# Patient Record
Sex: Female | Born: 1935 | Race: White | Hispanic: No
Health system: Southern US, Community
[De-identification: ages and names within clinical notes are randomized; demographics above are authoritative.]

---

## 1998-08-28 ENCOUNTER — Encounter: Admission: RE | Admit: 1998-08-28 | Discharge: 1998-09-11 | Payer: Self-pay | Admitting: Family Medicine

## 1999-11-17 ENCOUNTER — Encounter: Admission: RE | Admit: 1999-11-17 | Discharge: 1999-11-17 | Payer: Self-pay | Admitting: Family Medicine

## 1999-11-17 ENCOUNTER — Encounter: Payer: Self-pay | Admitting: Family Medicine

## 1999-11-27 ENCOUNTER — Encounter: Admission: RE | Admit: 1999-11-27 | Discharge: 2000-02-25 | Payer: Self-pay | Admitting: Anesthesiology

## 2000-04-04 ENCOUNTER — Encounter: Admission: RE | Admit: 2000-04-04 | Discharge: 2000-07-03 | Payer: Self-pay | Admitting: Anesthesiology

## 2000-05-24 ENCOUNTER — Ambulatory Visit (HOSPITAL_COMMUNITY): Admission: RE | Admit: 2000-05-24 | Discharge: 2000-05-24 | Payer: Self-pay | Admitting: Gastroenterology

## 2000-06-21 ENCOUNTER — Encounter: Payer: Self-pay | Admitting: Emergency Medicine

## 2000-06-21 ENCOUNTER — Inpatient Hospital Stay (HOSPITAL_COMMUNITY): Admission: EM | Admit: 2000-06-21 | Discharge: 2000-06-22 | Payer: Self-pay | Admitting: Emergency Medicine

## 2000-07-04 ENCOUNTER — Encounter: Admission: RE | Admit: 2000-07-04 | Discharge: 2000-09-14 | Payer: Self-pay | Admitting: Anesthesiology

## 2000-08-04 ENCOUNTER — Encounter: Admission: RE | Admit: 2000-08-04 | Discharge: 2000-08-04 | Payer: Self-pay | Admitting: Family Medicine

## 2000-08-04 ENCOUNTER — Encounter: Payer: Self-pay | Admitting: Family Medicine

## 2000-09-23 ENCOUNTER — Encounter: Admission: RE | Admit: 2000-09-23 | Discharge: 2000-10-15 | Payer: Self-pay | Admitting: Anesthesiology

## 2000-10-31 ENCOUNTER — Encounter: Payer: Self-pay | Admitting: Anesthesiology

## 2000-10-31 ENCOUNTER — Encounter: Admission: RE | Admit: 2000-10-31 | Discharge: 2000-10-31 | Payer: Self-pay | Admitting: Anesthesiology

## 2000-11-03 ENCOUNTER — Inpatient Hospital Stay (HOSPITAL_COMMUNITY): Admission: EM | Admit: 2000-11-03 | Discharge: 2000-11-04 | Payer: Self-pay | Admitting: Emergency Medicine

## 2000-12-14 ENCOUNTER — Ambulatory Visit (HOSPITAL_COMMUNITY): Admission: RE | Admit: 2000-12-14 | Discharge: 2000-12-15 | Payer: Self-pay | Admitting: Internal Medicine

## 2001-12-26 ENCOUNTER — Encounter: Payer: Self-pay | Admitting: Family Medicine

## 2001-12-26 ENCOUNTER — Encounter: Admission: RE | Admit: 2001-12-26 | Discharge: 2001-12-26 | Payer: Self-pay | Admitting: Family Medicine

## 2003-05-15 ENCOUNTER — Encounter: Admission: RE | Admit: 2003-05-15 | Discharge: 2003-05-15 | Payer: Self-pay | Admitting: Family Medicine

## 2003-11-28 ENCOUNTER — Ambulatory Visit (HOSPITAL_COMMUNITY): Admission: RE | Admit: 2003-11-28 | Discharge: 2003-11-28 | Payer: Self-pay | Admitting: Anesthesiology

## 2003-12-20 ENCOUNTER — Encounter: Admission: RE | Admit: 2003-12-20 | Discharge: 2003-12-20 | Payer: Self-pay | Admitting: Anesthesiology

## 2004-03-02 ENCOUNTER — Inpatient Hospital Stay (HOSPITAL_COMMUNITY): Admission: RE | Admit: 2004-03-02 | Discharge: 2004-03-06 | Payer: Self-pay | Admitting: Neurosurgery

## 2004-04-01 ENCOUNTER — Encounter: Admission: RE | Admit: 2004-04-01 | Discharge: 2004-04-01 | Payer: Self-pay | Admitting: Neurosurgery

## 2004-04-17 ENCOUNTER — Ambulatory Visit (HOSPITAL_COMMUNITY): Admission: RE | Admit: 2004-04-17 | Discharge: 2004-04-17 | Payer: Self-pay | Admitting: Neurological Surgery

## 2004-04-29 ENCOUNTER — Encounter: Admission: RE | Admit: 2004-04-29 | Discharge: 2004-04-29 | Payer: Self-pay | Admitting: Neurosurgery

## 2004-05-01 ENCOUNTER — Encounter: Admission: RE | Admit: 2004-05-01 | Discharge: 2004-05-01 | Payer: Self-pay | Admitting: Neurosurgery

## 2004-06-02 ENCOUNTER — Encounter: Admission: RE | Admit: 2004-06-02 | Discharge: 2004-06-02 | Payer: Self-pay | Admitting: Neurosurgery

## 2004-06-02 ENCOUNTER — Ambulatory Visit (HOSPITAL_COMMUNITY): Admission: RE | Admit: 2004-06-02 | Discharge: 2004-06-02 | Payer: Self-pay | Admitting: Neurosurgery

## 2004-06-18 ENCOUNTER — Encounter: Admission: RE | Admit: 2004-06-18 | Discharge: 2004-06-18 | Payer: Self-pay | Admitting: Neurosurgery

## 2004-06-19 ENCOUNTER — Encounter: Admission: RE | Admit: 2004-06-19 | Discharge: 2004-06-19 | Payer: Self-pay | Admitting: Neurosurgery

## 2004-07-28 ENCOUNTER — Encounter: Admission: RE | Admit: 2004-07-28 | Discharge: 2004-07-28 | Payer: Self-pay | Admitting: Family Medicine

## 2004-09-17 ENCOUNTER — Encounter: Admission: RE | Admit: 2004-09-17 | Discharge: 2004-09-17 | Payer: Self-pay | Admitting: Neurosurgery

## 2005-03-11 ENCOUNTER — Encounter: Admission: RE | Admit: 2005-03-11 | Discharge: 2005-03-11 | Payer: Self-pay | Admitting: Neurosurgery

## 2005-06-10 IMAGING — CR DG LUMBAR SPINE 2-3V
3 series · 3 of 3 positions shown · non-contrast
Comparison: none

CLINICAL DATA: Followup spinal fusion.  
 LUMBAR SPINE ? TWO VIEW:
 Comparison is made to intraoperative views from 03/02/04.  
 The patient has had posterior laminectomy with interbody fusion at L3-4.  There are bilateral pedicle screws with posterior rod fixation.  Interbody fusion material is in place.  Findings appear very similar to the operative films.  There is some end plate irregularity towards the right with sclerosis but these findings were present on the preoperative films of last [REDACTED].  I do not see any sign of radiographically detectible complication.

[view not recorded (1 of 3)]
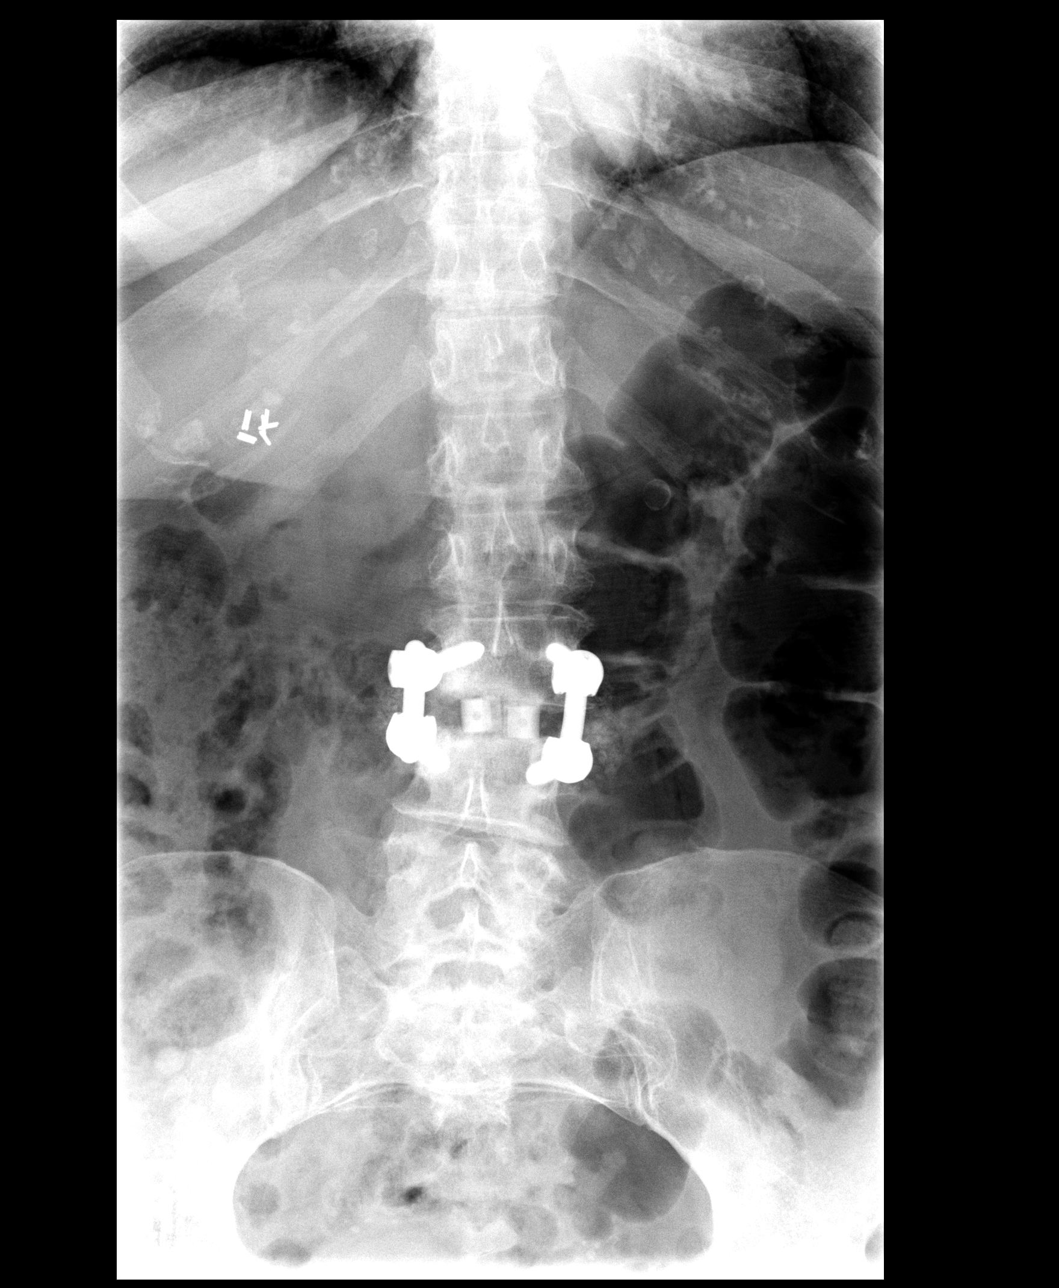

[view not recorded (2 of 3)]
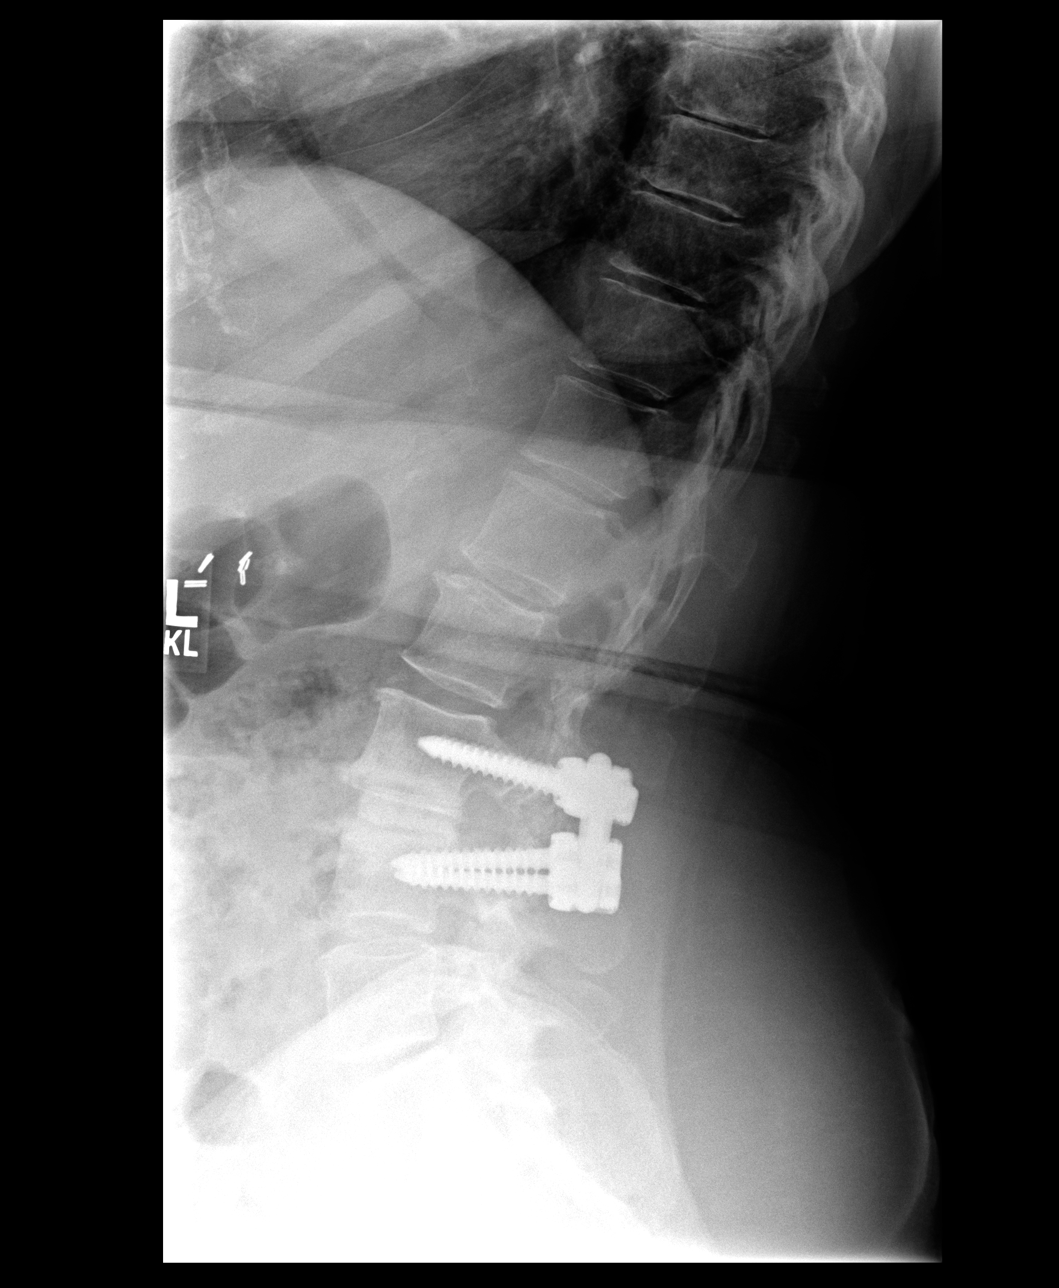

[view not recorded (3 of 3)]
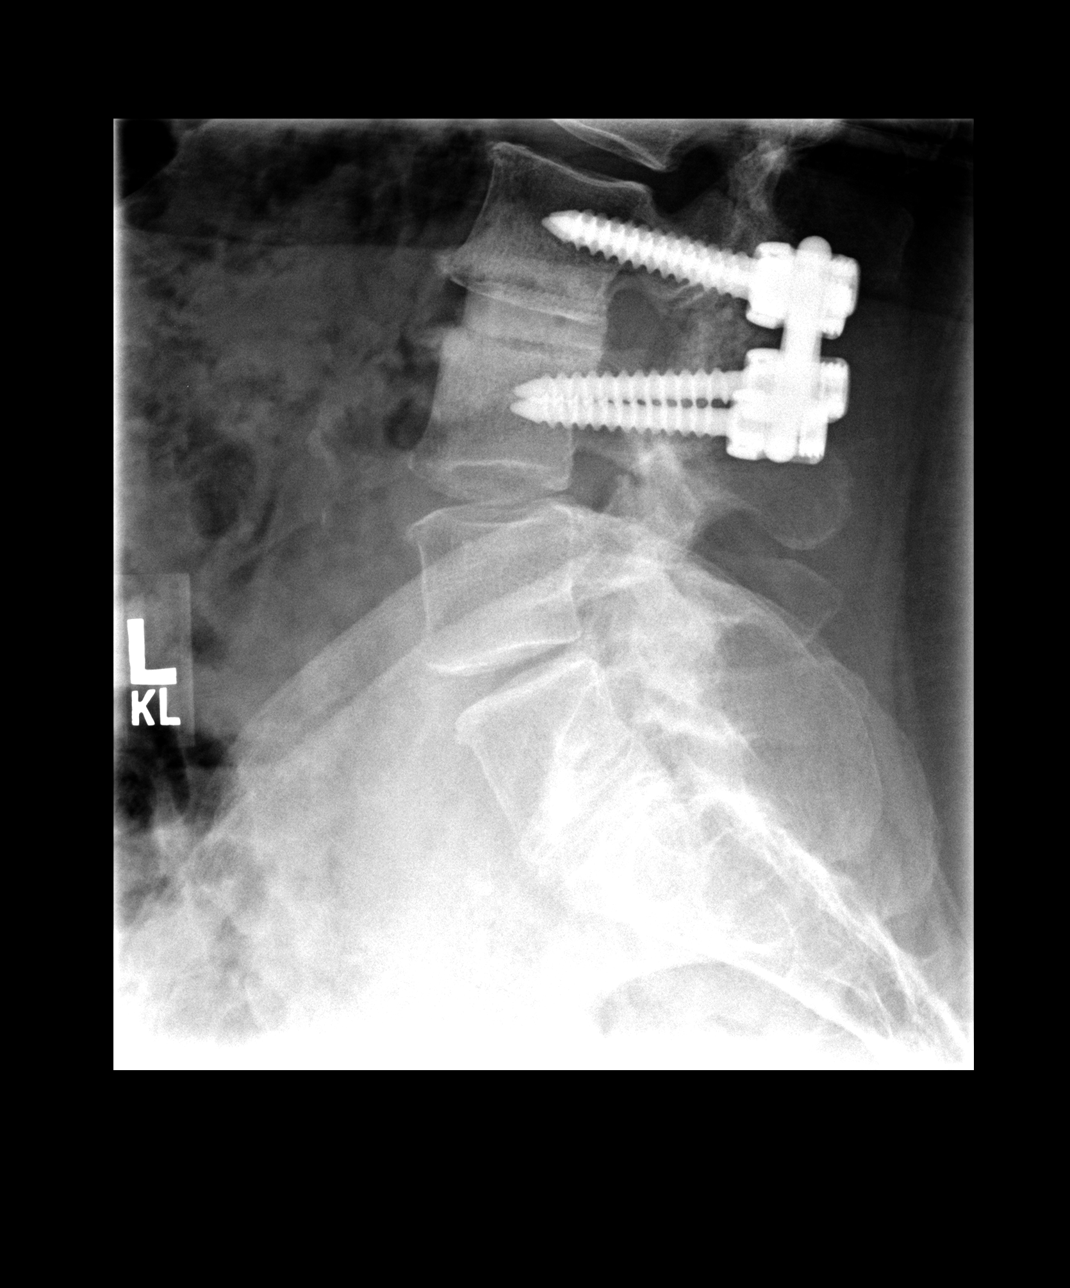

[3 of 3 positions shown; findings below may reference images not displayed]

IMPRESSION: See above report.

## 2005-07-08 IMAGING — CR DG LUMBAR SPINE 2-3V
3 series · 3 of 3 positions shown · non-contrast
Comparison: Intraoperative lumbar spine 03/02/1998. MRI lumbar spine 11 [DATE].

CLINICAL DATA: L3-L4 fusion February 2004. Routine followup.

LUMBAR  SPINE - 2 VIEW 04/29/2004:

[view not recorded (1 of 3)]
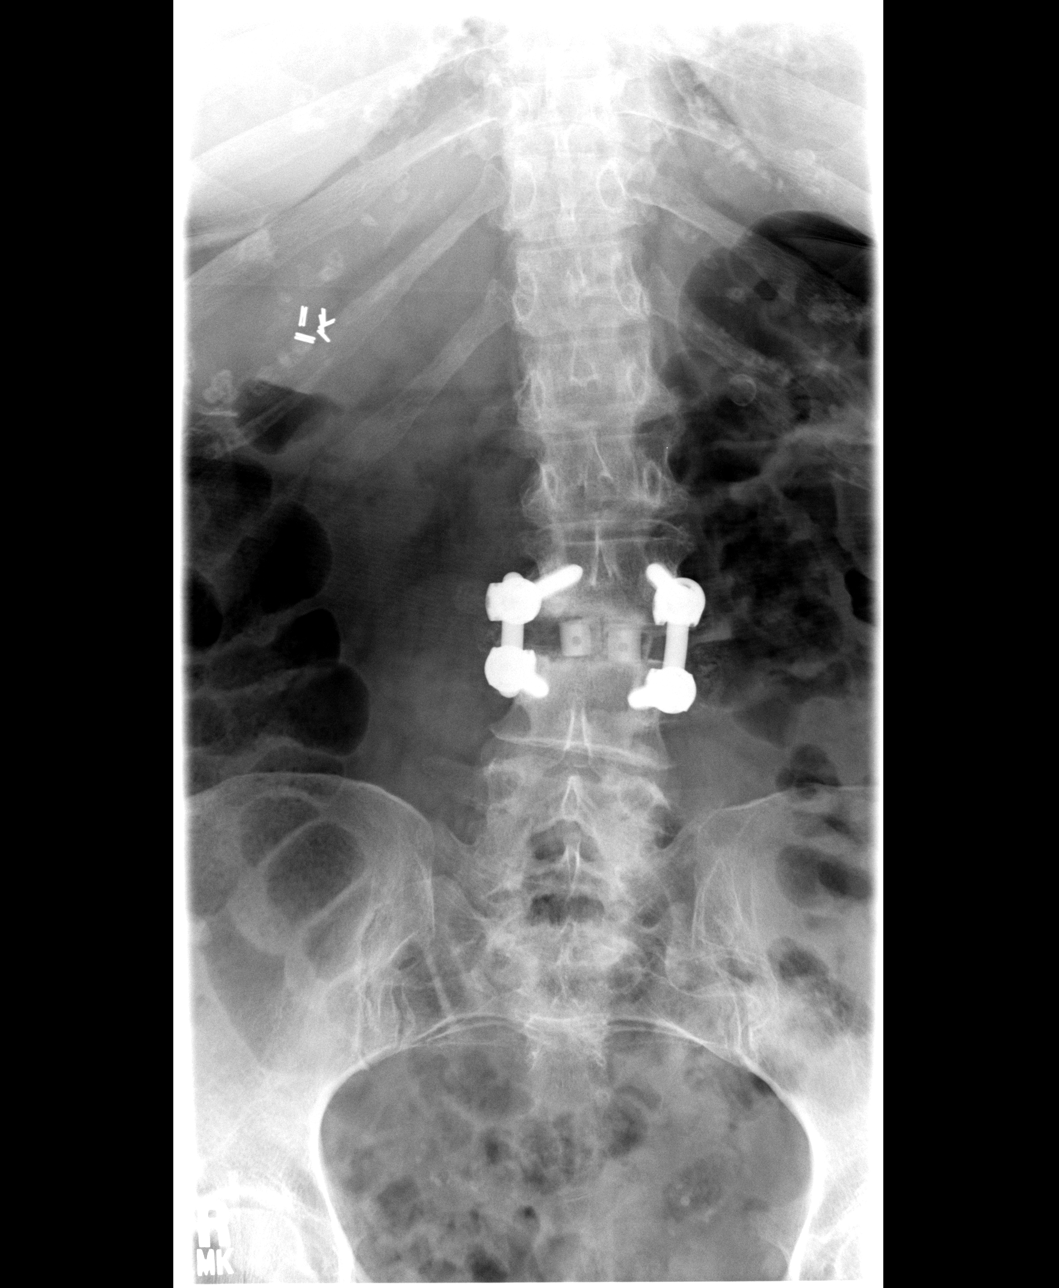

[view not recorded (2 of 3)]
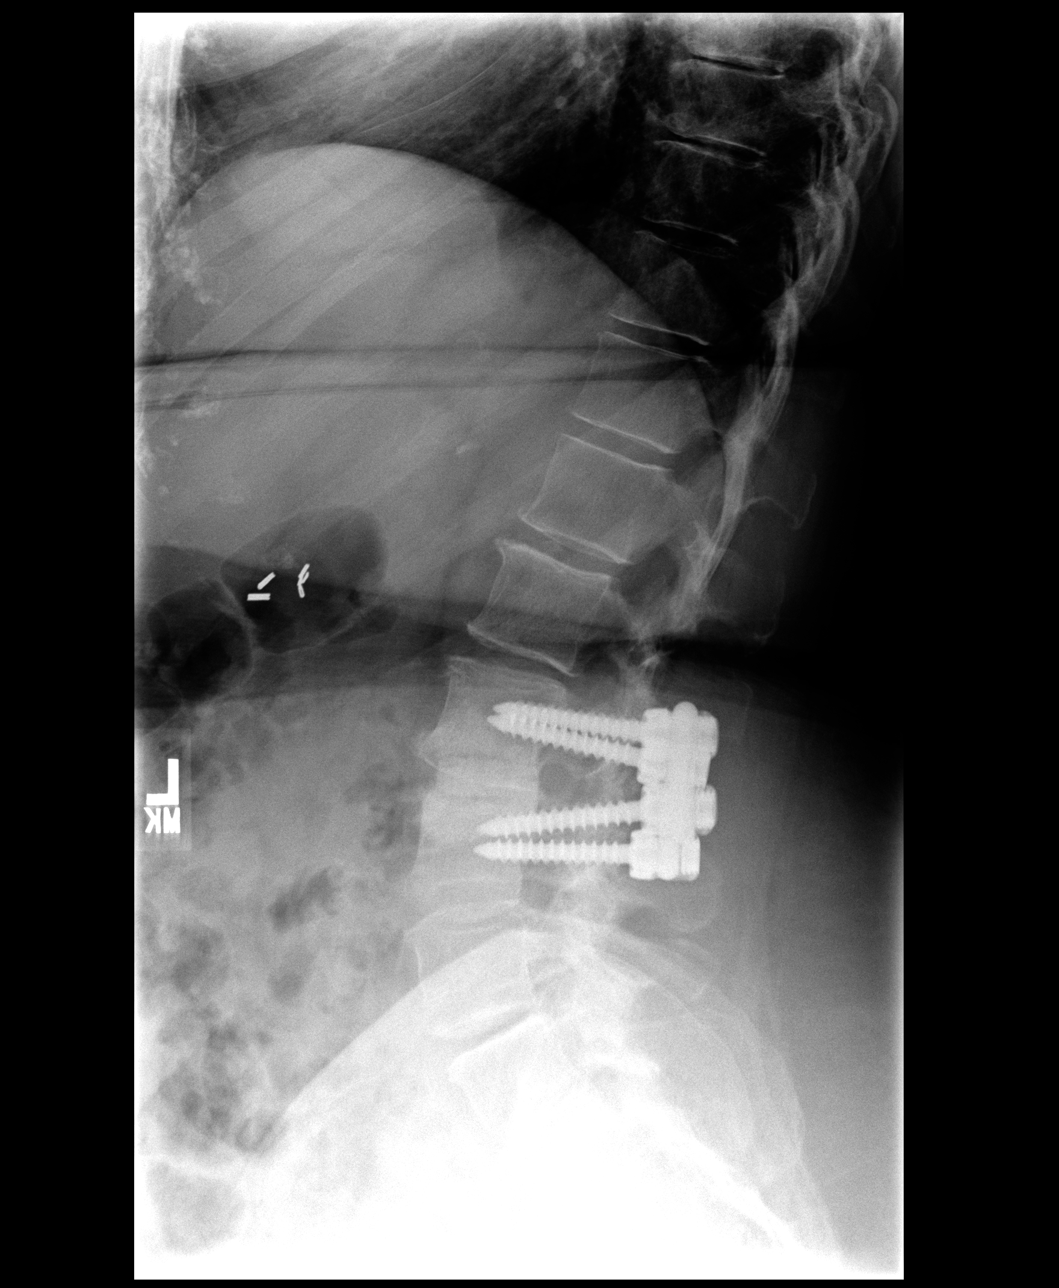

[view not recorded (3 of 3)]
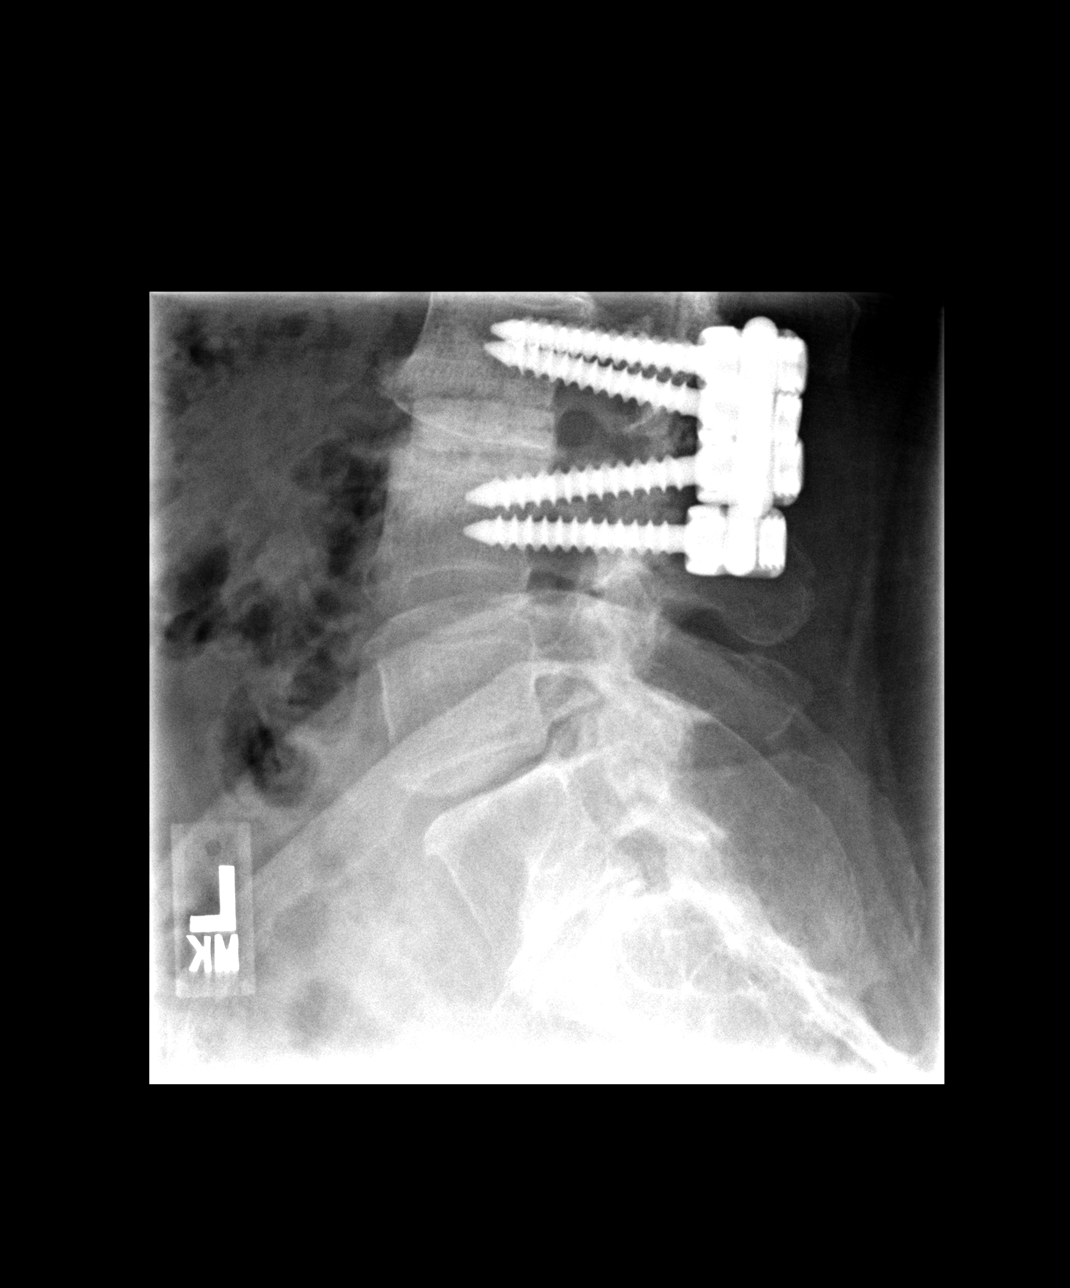

[3 of 3 positions shown; findings below may reference images not displayed]

FINDINGS: The same numbering scheme as that used on the MRI is used for this
examination. S1 is a transitional lumbosacral segment with a well-defined disc
space between it and S 2.

Patient has undergone posterolateral fusion with hardware and interbody fusion
with plugs at L3-L4. Posterior alignment is anatomic. No complicating features
are noted. Degenerative disc disease and spondylosis is present at L2-L3. There
is slight scoliosis convex left. Visualized sacroiliac joints appear intact.
IMPRESSION: 1. Anatomic alignment status post L3-L4 posterolateral fusion with hardware and
interbody fusion with plugs. No complicating features.

2. Degenerative disc disease and spondylosis at L2-L3.

3. Transitional S1 segment.

## 2005-08-11 IMAGING — CR DG LUMBAR SPINE 1V
1 series · 1 of 1 positions shown · non-contrast
Comparison: none

CLINICAL DATA: Posterior fusion.  Back pain.
 LUMBAR SPINE:
 A lateral view of the lumbar spine is compared to a prior lateral view of 04/29/04. Posterior transpedicular fusion with interbody fusion plug at L3-4 appears stable.  Normal alignment is maintained.  Degenerative disk disease at L5-S1 is unchanged.

[view not recorded]
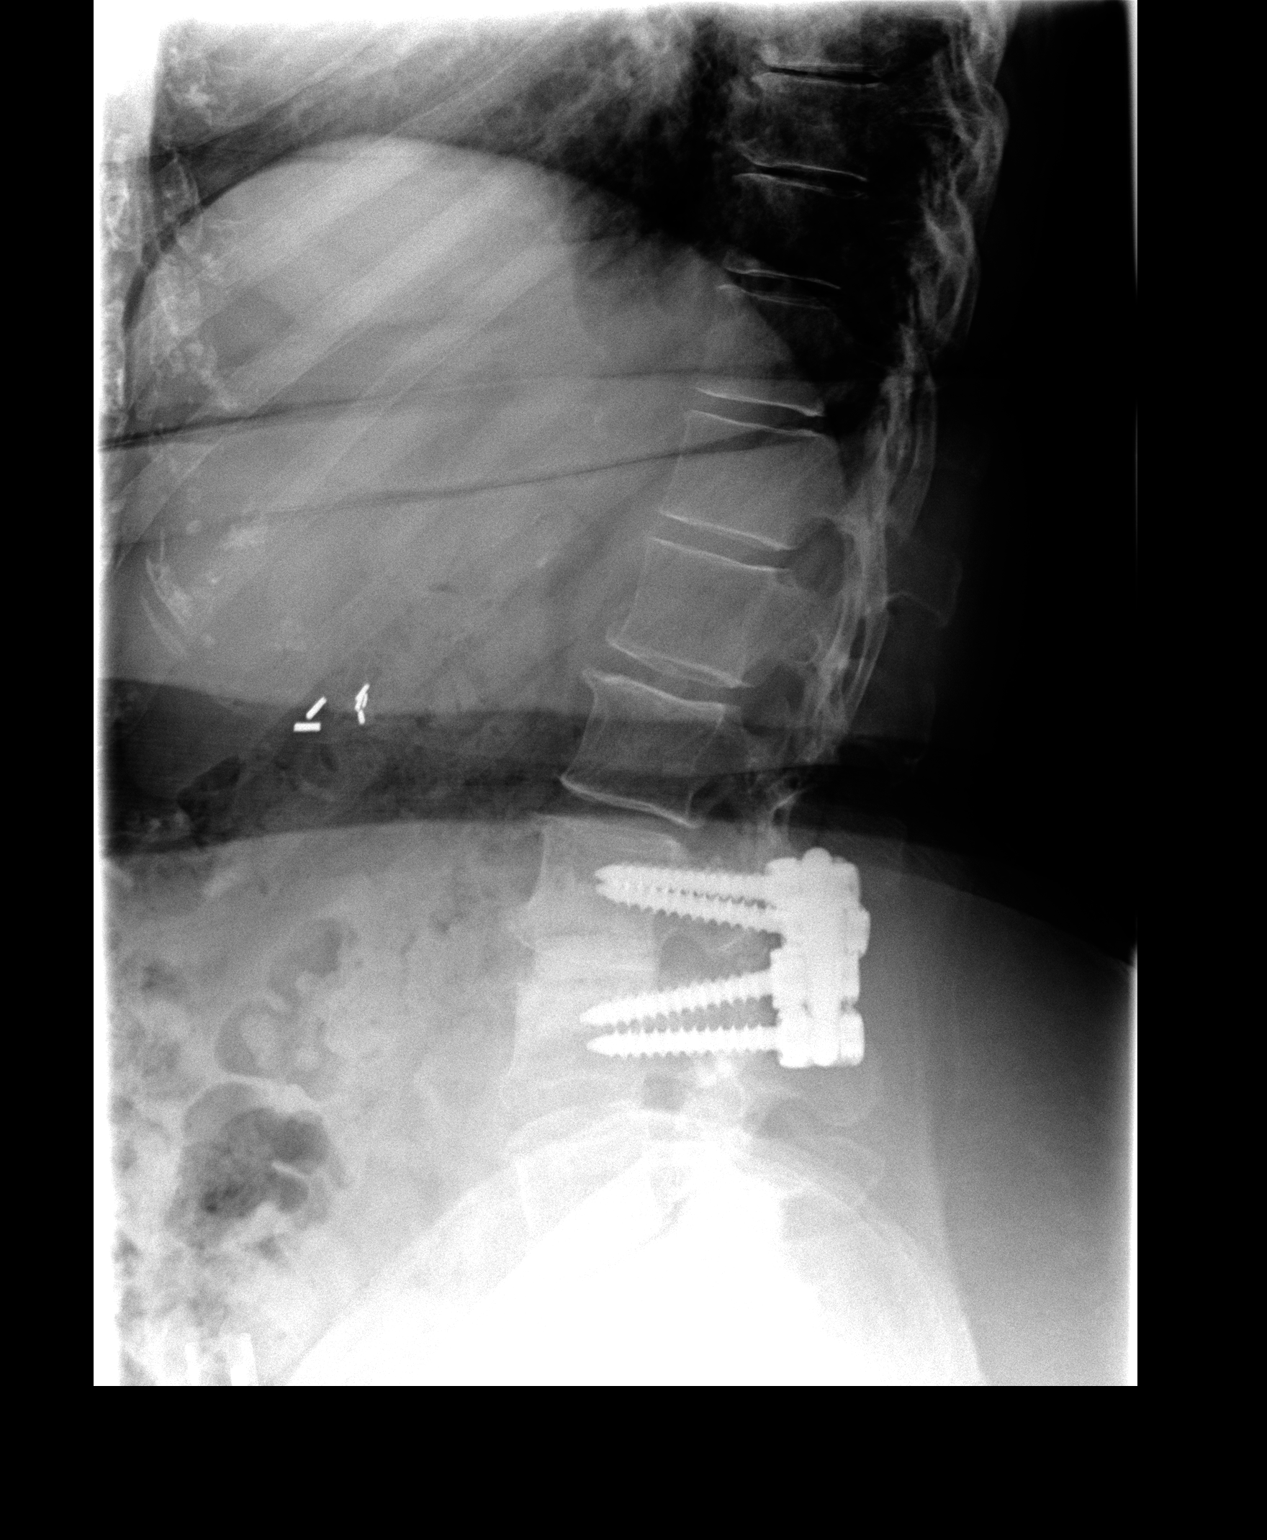

[1 of 1 positions shown; findings below may reference images not displayed]

IMPRESSION: Stable posterior fusion at L3-4.  Normal alignment.

## 2005-08-23 ENCOUNTER — Encounter: Admission: RE | Admit: 2005-08-23 | Discharge: 2005-08-23 | Payer: Self-pay | Admitting: Family Medicine

## 2006-05-05 ENCOUNTER — Encounter: Admission: RE | Admit: 2006-05-05 | Discharge: 2006-05-05 | Payer: Self-pay | Admitting: Family Medicine

## 2007-09-13 ENCOUNTER — Encounter: Admission: RE | Admit: 2007-09-13 | Discharge: 2007-09-13 | Payer: Self-pay | Admitting: Family Medicine

## 2008-01-12 ENCOUNTER — Encounter: Admission: RE | Admit: 2008-01-12 | Discharge: 2008-01-12 | Payer: Self-pay | Admitting: Family Medicine

## 2008-10-10 ENCOUNTER — Encounter: Admission: RE | Admit: 2008-10-10 | Discharge: 2008-10-10 | Payer: Self-pay | Admitting: Orthopedic Surgery

## 2008-10-15 ENCOUNTER — Ambulatory Visit (HOSPITAL_BASED_OUTPATIENT_CLINIC_OR_DEPARTMENT_OTHER): Admission: RE | Admit: 2008-10-15 | Discharge: 2008-10-16 | Payer: Self-pay | Admitting: Orthopedic Surgery

## 2009-12-10 ENCOUNTER — Encounter: Admission: RE | Admit: 2009-12-10 | Discharge: 2009-12-10 | Payer: Self-pay | Admitting: Family Medicine

## 2010-05-23 LAB — BASIC METABOLIC PANEL
BUN: 17 mg/dL (ref 6–23)
CO2: 29 mEq/L (ref 19–32)
Calcium: 8.9 mg/dL (ref 8.4–10.5)
Chloride: 100 mEq/L (ref 96–112)
Creatinine, Ser: 0.52 mg/dL (ref 0.4–1.2)
GFR calc Af Amer: >60 mL/min (ref >60)
GFR calc non Af Amer: >60 mL/min (ref >60)
Glucose, Bld: 93 mg/dL (ref 70–99)
Potassium: 4.2 mEq/L (ref 3.5–5.1)
Sodium: 138 mEq/L (ref 135–145)

## 2010-05-27 ENCOUNTER — Other Ambulatory Visit: Payer: Self-pay | Admitting: Orthopaedic Surgery

## 2010-05-27 DIAGNOSIS — M545 Low back pain, unspecified: Secondary | ICD-10-CM

## 2010-06-02 ENCOUNTER — Ambulatory Visit
Admission: RE | Admit: 2010-06-02 | Discharge: 2010-06-02 | Disposition: A | Discharge status: Home / Self Care | Payer: Medicare Other | Source: Ambulatory Visit | Attending: Orthopaedic Surgery | Admitting: Orthopaedic Surgery

## 2010-06-02 DIAGNOSIS — M545 Low back pain: Secondary | ICD-10-CM

## 2010-06-30 NOTE — Op Note (Signed)
NAME:  [name], [name]             ACCOUNT NO.:  [id number]   MEDICAL RECORD NO.:  [id number]          PATIENT TYPE:  AMB   LOCATION:  DSC                          FACILITY:  [facility]   PHYSICIAN:  Robert A. Thurston Hole, M.D. DATE OF BIRTH:  1925-12-17   DATE OF PROCEDURE:  10/15/2008  DATE OF DISCHARGE:                               OPERATIVE REPORT   PREOPERATIVE DIAGNOSES:  1. Right shoulder rotator cuff tear.  2. Right shoulder partial labrum tear.  3. Right shoulder impingement.  4. Right shoulder acromioclavicular joint degenerative joint disease      and spurring.   POSTOPERATIVE DIAGNOSES:  1. Right shoulder rotator cuff tear.  2. Right shoulder partial labrum tear.  3. Right shoulder impingement.  4. Right shoulder acromioclavicular joint degenerative joint disease      and spurring.   PROCEDURE:  1. Right shoulder EUA followed by arthroscopically-assisted rotator      cuff repair using Arthrex suture anchor x1 plus PushLock anchor x1.  2. Right shoulder debridement partial labrum tear.  3. Right shoulder subacromial decompression.  4. Right shoulder distal clavicle excision.   SURGEON:  Elana Alm. Thurston Hole, MD   ASSISTANT:  Julien Girt, PA   ANESTHESIA:  General.   OPERATIVE TIME:  One hour.   COMPLICATIONS:  None.   INDICATIONS FOR PROCEDURE:  [name] is an 77 year old woman who  injured her right shoulder approximately 8 months ago with a fall.  She  has had significant persistent pain with exam and MRI documenting a  rotator cuff tear which has failed to heal and she is now to undergo  arthroscopy with rotator cuff repair.   DESCRIPTION:  [name] was brought to operating room on October 15, 2008, after an interscalene block was placed in holding room by  anesthesia.  She was placed on the operative table in supine position.  She received Ancef 1 g IV preoperatively for prophylaxis.  After being  placed under general anesthesia, her right  shoulder was examined.  She  had full range of motion, and her shoulder was stable on ligamentous  exam.  She was then placed in a beach-chair position and her shoulder  and arm were prepped using sterile DuraPrep and draped using sterile  technique.  Originally through a posterior arthroscopic portal, the  arthroscope with a pump attached was placed and through an anterior  portal an arthroscopic probe was placed.  On initial inspection, the  articular cartilage in the glenohumeral joint was found be intact.  The  anterior, superior, and posterior labrum showed partial tearing 25%  which was debrided.  The anterior-inferior labrum and anterior-inferior  glenohumeral ligament complex was intact.  Biceps tendon anchor and  biceps tendon was intact.  The rotator cuff showed a complete tear of  the supraspinatus and the anterior 50% of the infraspinatus and this was  partially debrided arthroscopically.  The rest of the rotator cuff was  intact.  Inferior capsular recess free of pathology.  Subacromial space  was entered, and lateral arthroscopic portal was made.  Moderately  thickened bursitis was resected.  Impingement was  noted and a  subacromial decompression was carried out removing 6-8 mm of the  undersurface of the anterior, anterolateral, and anteromedial acromion,  and CA ligament release carried out as well.  The [facility] joint showed  significant spurring and degenerative changes and the distal 6 mm of  clavicle was resected with a 6-mm bur.  After this was done then through  an accessory lateral portal, the rotator cuff tear was repaired  primarily with one Arthrex suture anchor with 2 mattress sutures placed  from the sutures in this anchor.  After this was done, then the repair  was further enhanced with one PushLock anchor in the greater tuberosity  with firm and tight fixation.  After this was done, the shoulder could  be brought through a full range of motion with no impingement on  the  repair.  At this point, it was felt that all pathology had been  satisfactorily addressed.  The instruments were removed.  Portals were  closed with 3-0 nylon suture.  Sterile dressings and a sling applied,  and the patient awakened and taken to recovery in stable condition.   FOLLOWUP CARE:  [name] will be followed overnight for  observation.  She will be discharge tomorrow on Percocet and Robaxin.  She will start early physical therapy.  She will be seen back in the  office in a week for sutures out and followup.      Robert A. Thurston Hole, M.D.  Electronically Signed     RAW/MEDQ  D:  10/15/2008  T:  10/16/2008  Job:  [id number]

## 2010-07-03 NOTE — Op Note (Signed)
[name], [name]             ACCOUNT NO.:  [id number]   MEDICAL RECORD NO.:  [id number]          PATIENT TYPE:  INP   LOCATION:  2899                         FACILITY:  [facility]   PHYSICIAN:  Donalee Citrin, M.D.        DATE OF BIRTH:  February 08, 1926   DATE OF PROCEDURE:  03/02/2004  DATE OF DISCHARGE:                                 OPERATIVE REPORT   PREOPERATIVE DIAGNOSIS:  1.  Degenerative spondylolisthesis L3-4 with severe lumbar spinal stenosis.  2.  Degenerative scoliosis L3-4 with severe foraminal collapse and      displacement of the L4 neuroforamen, predominantly on the right.  3.  Left greater than right L3 and L4 radiculopathies.  4.  Severe mechanical and diskogenic low back pain with mechanical      instability.   POSTOPERATIVE DIAGNOSIS:  1.  Degenerative spondylolisthesis L3-4 with severe lumbar spinal stenosis.  2.  Degenerative scoliosis L3-4 with severe foraminal collapse and      displacement of the L4 neuroforamen, predominantly on the right.  3.  Left greater than right L3 and L4 radiculopathies.  4.  Severe mechanical and diskogenic low back pain with mechanical      instability.   PROCEDURE:  1.  Decompressive laminectomy L3-4 for lumbar spinal stenosis decompression      in excess of what would normally be needed for a post lumbar interbody      fusion due to the severity of the degenerative scoliosis and lumbar      spinal stenosis.  2.  Posterior lumbar interbody fusion L3-4 using 8 x 26 mm transient      allograft wedges.  3.  Pedicle screw fixation L3-4 using the M10 Legacy pedicle screw system      from [facility] with 6.5 x 40 pedicle screws inserted at L4 and L5.  4.  Posterolateral arthrodesis L3-4 using locally harvested autograft.  5.  Open reduction spinal deformity L3-4 with reduction of degenerative      spondylolisthesis.  6.  Placement of medium Hemovac drain.   SURGEON:  Donalee Citrin, M.D.   ASSISTANT:  Kathaleen Maser. Pool, M.D.   ANESTHESIA:   General endotracheal.   FINDINGS:  Severe lumbar spinal stenosis from severe degenerative facet  disease with facet arthropathy causing severe compression of the L3 and L4  nerve roots, predominantly on the right side with severe degenerative  collapse and scoliotic deformity at L3-4 on the right.   INDICATIONS FOR PROCEDURE:  The patient is a very pleasant 77 year old  female who has had longstanding back and bilateral leg pain, worse on the  left with radiation down the anterior shin consistent with an L4  radiculopathy.  The patient also had back pain much greater than leg pain,  mechanical in nature, worse in going from lying to sitting and sitting to  standing position.  The patient for the last several years has been involved  with pain management with failure of conservative treatment with epidural  steroid injections, physical therapy, and time.  The patient has progressive  worsening pain and has elected to proceed forward  with decompression and  stabilization procedure.  I discussed all the risks of surgery with her.  She understands and agrees to proceed forward.   DESCRIPTION OF PROCEDURE:  The patient was brought to the OR, was induced  under general anesthesia, positioned prone on the Wilson frame.  Back was  prepped and draped in the usual sterile fashion.  Preoperative x-ray  localized the L3-4 interspace.  After infiltration with 10 mL of lidocaine  with epinephrine, Bovie electrocautery was used to take down the  subcutaneous tissue and subperiosteal dissection was carried out at the  lamina of L3 and L4, exposing the TP of L3 and L4.  Intraoperative x-ray  confirmed localization of the L3 TP.  Taking into account the patient had a  transitional anatomy with the transitional level being labeled S1.  Then the  facet complex at L3-4 on the left was noted to be markedly degenerated and  hypertrophic, displaced posteriorly.  This was partially bitten off with the  Leksell  rongeur and the high speed drill was used to drill down the medial  aspect of the facet complex and the spinous process of L3 was removed  exposing the ligamentum flavum which was removed in a piecemeal fashion.  Then the central laminectomy was begun.  It was noted to be severely  stenotic.  Using a 4 Penfield, the dura was teased away from the  undersurface of the lamina and this was underbitten with 3 mm Kerrison punch  removing the central lamina.  Then the medial aspect of facet complex were  underbitten with 3 and 4 mm Kerrison punches removing the complete medial  facet, keeping the ligament intact as a protective barrier against the dura.  There was also noted to be severe stenosis coming off the superior aspect of  lamina of L4, so this had to be removed in piecemeal fashion also.  Decompressing the central canal.  Then the ligamentum flavum was noted to be  markedly hypertrophic and causing severe hourglass compression,  predominantly on the right side of the thecal sac and the L4 and L5 roots.  It was freed up with a 4 Penfield and this was bitten away with the 4 mm  Kerrison punch.  Then the pedicle was identified at L4 and laminectomy was  completed flush with this pedicle.  Then the L3 nerve root was identified at  each side and unroofed, decompressing the L3 neuroforamen.  After both the 3  and the 4 roots were decompressed, the lateral facet complexes were  underbitten to expose the lateral margins of the interspaces.  Attention was  taken to the interbody work.  The epidural veins were coagulated.  The  D'Errico nerve retractor was used to reflect the right L4 nerve root  medially.  Annulotomy was made and this disk space was noted to be markedly  collapsed and degenerated.  A size 7 distractor was inserted to open up this  disk space, turned counterclockwise to help reduce the dextrorotoscoliosis. Then after the distractor was inserted, attention was taken to the right   side.  The right interspace was cleaned out.  This again was noted to be  markedly degenerated and a size 9 distractor was inserted here, also  reducing the scoliotic deformity.  The 7 distractor was removed.  The size 8  cutting chisel used to prepare the interspace.  Central disks were removed  with downgoing Epstein curet and pituitary rongeurs and 8 x 26 mm Tangent  allograft was  inserted on the right side.  Then this procedure was repeated  on the left side with locally harvested allograft packed against the right  side allograft and on the left side allograft was inserted. Fluoroscopy  confirming depth and position at each step along the way and also this  helped restore her AP alignment due to her scoliosis.  Then attention was  taken to pedicle screw placement.  TP's were identified.  Pilot holes were  drilled with high speed drill.  Cannulated with the awl, probed, tapped with  a 5.5 tap, and a 6.5 x 40 screw inserted at L4 on the left.  Pedicles were  probed from within the pedicle and within the canal to confirm no medial and  lateral breach.  The L5 pedicle screw on the left was inserted in a similar  fashion as well as the L4 and L5 screws on the right.  After all four  pedicle screws were inserted, the wound was copiously irrigated and  meticulous hemostasis was maintained.  Aggressive decortication was carried  out in the lateral gutters and TP's at this level.  The remainder of the  locally harvested allograft was packed in the lateral gutters.  Then 30 mm  rod on the right and a 40 mm rod on the left was inserted,  nuts tightened  down at L4.  The L3 pedicle screw was compressed against the L4  significantly on the left and just mildly on the right to anchor the grafts.  Then the neuroforamen reexplored and confirmed no graft material had  migrated down the neuroforamen.  They were widely decompressed.  Then  Gelfoam was laid over top of the dura.  The muscle and fascia  were  reapproximated after fluoroscopy  confirmed good position of rods, screws, and bone grafts.  Then the  subcutaneous tissue was closed with 2-0 interrupted Vicryl and the skin was  closed with a running 4-0 subcuticular.  Benzoin and Steri-Strips applied.  The patient was taken to the recovery room in stable condition.       GC/MEDQ  D:  03/02/2004  T:  03/02/2004  Job:  [id number]

## 2010-07-03 NOTE — Consult Note (Signed)
[facility]  Patient:    [name], [name]                    MRN: [id number] Proc. Date: 02/02/00 Adm. Date:  [id number] Attending:  Thyra Breed CC:         Duncan Dull, M.D.  Marlan Palau, M.D.   Consultation Report  FOLLOW-UP EVALUATION  [name] comes in for follow-up evaluation of her underlying low-back pain and knee discomfort.  Since her last evaluation, the patient has noted that she has less radicular discomfort and more pain localized to the lower back and out to the left SI joint region.  She continues on the Vioxx and on the desipramine but is only taking 25 mg of desipramine per day.  She notes that she is having increased pain over the lateral aspect of her left thigh.  She has seen Dr. Thurston Hole since her last visit, and he did not change anything. Overall, she is somewhat stressed over the holidays but does feel that she is doing a lot more than she did last year.  She notes that bending and twisting makes her pain worse, and rest improves it.  CURRENT MEDICATIONS: 1. Vioxx 25 mg per day. 2. Hydrochlorothiazide 25 mg per day. 3. Avapro 300 mg per day. 4. Tylenol p.r.n. 5. Nortriptyline 25 mg at night.  PHYSICAL EXAMINATION:  Blood pressure is 140/77.  Heart rate is 96, respiratory rates 16, O2 saturations 98%.  Pain level is 6/10.  The patient demonstrates symmetric deep tendon reflexes with negative straight leg raise signs.  She has hyperalgesia over the lateral aspect of her left thigh.  IMPRESSION: 1. Low-back pain with left thigh pain.  I suspect that she may have either a    mild radicular pain versus myalgia paresthetica with some ongoing SI joint    discomfort. 2. Other medical problems per her primary care physician.  DISPOSITION: 1. Increase the desipramine to 25 mg 2 p.o. q.p.m. #60 with 4 refills. 2. Continue on other medications. 3. Follow up with me in eight weeks. DD:  02/02/00 TD:  02/02/00 Job:  [id number]

## 2010-07-03 NOTE — H&P (Signed)
[facility]  Patient:    [name], [name]                    MRN: [id number] Adm. Date:  [id number] Attending:  Thyra Breed CC:         Marlan Palau, M.D.  Duncan Dull, M.D.   History and Physical  FOLLOWUP EVALUATION:  The patient comes in for followup evaluation of her chronic low back pain radiating out into the left lower extremity.  Since her last evaluation, she had about six weeks of good pain relief from the epidural steroid injection but her pain is beginning to recur.  She did not find the desipramine very helpful and has gone back on the nortriptyline but is only taking one at night, which she states partially relieves her discomfort.  She tolerates it well.  She complains more of pain radiating from the hip down along the lateral aspect of the thigh to the knee.  She has bilateral foot discomfort.  Her discomfort is exacerbated by bending, going up stairs or standing.  It is improved by rest.  Her other medications include Vioxx, hydrochlorothiazide, Avapro, aspirin and Tylenol.  She has tried the apple cider vinegar with honey and noted minimal improvement after four weeks.  PHYSICAL EXAMINATION  VITAL SIGNS:  Blood pressure 169/58, heart rate is 106, respiratory rate is 20, O2 saturation is 97% and pain level is 7/10.  NEUROLOGIC:  Straight leg raise signs are negative.  Deep tendon reflexes were symmetric at the knees.  She has hyperalgesia over the lateral aspect of the left thigh.  IMPRESSION 1. Low back pain with left thigh pain which I suspect is meralgia paresthetica    versus radicular pain. 2. Other medical problems per primary care physician.  DISPOSITION 1. Increase nortriptyline to 25 mg two p.o. q.p.m. for two weeks and if no    improvement, to three p.o. q.p.m. 2. Follow up with me in four weeks. 3. I advised the patient that she has good range of motion of her knee today    with minimal crepitus or  effusion.  I doubt that an orthopedic intervention    at this point would add much to what has already been done.  She has had    Synvisc injections and corticosteroid injections. DD:  04/05/00 TD:  04/06/00 Job: [id number]

## 2010-07-03 NOTE — H&P (Signed)
[facility]  Patient:    [name], [name]                    MRN: [id number] Adm. Date:  [id number] Attending:  Thyra Breed CC:         Duncan Dull, M.D.  Marlan Palau, M.D.  Luis Abed, M.D. [facility]   History and Physical  HISTORY OF PRESENT ILLNESS:  The patient has been hospitalized since her last visit for a cardiac workup and treated for hypertension with Cardizem.  Since being started on Cardizem, she has developed reddish swelling of her feet to the point that she cannot wear her shoes.  Her discomfort is increased by weightbearing.  This is associated with increased heat of the feet and reddish discoloration.  It feels like pins and needles when she walks on her feet. She has also developed some lower back discomfort.  She was taken off of the Vioxx and is asking about getting an epidural steroid therapy.  CURRENT MEDICATIONS:  Hydrochlorothiazide, Cardizem, aspirin, Tylenol, nortriptyline.  PHYSICAL EXAMINATION:  VITAL SIGNS:  Blood pressure 142/82.  Heart rate is 77.  Respiratory rate is 20.  O2 saturation is 96%.  Pain level is 7/10.  EXTREMITIES:  Deep tendon reflexes are symmetric.  She has reddish discoloration from the midfeet down with increased warmth of her feet and swelling.  It is tender to touch.  It looks very consistent with erythromelalgia.  IMPRESSION: 1. 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 2. Low back pain syndrome on the basis of lumbar spondylosis. 3. Other medical problems per Dr. Duncan Dull.  DISPOSITION: 1. Because of the significant changes in her feet, we went ahead and contacted    Dr. Lestine Box office and he is graciously working her in on Friday    and we will hopefully get this note to him so that he  understands our    concerns. 2. I advised the patient that if her discomfort did not improve after    medication adjustments, if they are indicated, then we would go ahead and    proceed with epidural steroid injections.  She will let us know as to the    outcome of her visit with Dr. Myrtis Ser. DD:  07/05/00 TD:  07/06/00 Job: [id number]

## 2010-07-03 NOTE — Procedures (Signed)
[facility]  Patient:    [name], [name]                    MRN: [id number] Proc. Date: 08/03/00 Adm. Date:  [id number] Attending:  Thyra Breed CC:         Duncan Dull, M.D.  Marlan Palau, M.D.  Luis Abed, M.D. [facility]   Procedure Report  PREOPERATIVE DIAGNOSIS:  Low back pain on the basis of lumbar spondylosis.  POSTOPERATIVE DIAGNOSIS:  Low back pain on the basis of lumbar spondylosis.  PROCEDURE:  Lumbar epidural steroid injection.  SURGEON:  Thyra Breed, M.D.  INTERVAL HISTORY:  The patient has been back to see Dr. Myrtis Ser and was taken off the Cardizem, and placed on Tenormin, and her feet are doing much better with less redness and swelling.  She continues on her other medications unchanged. She presents today for an epidural steroid injection.  PHYSICAL EXAMINATION:  Blood pressure 151/78, heart rate 70, respiratory rate 18, and O2 saturations 98%.  Pain level is 7 out of 10.  She has much less redness of her feet and less swelling.  Deep tendon reflexes were symmetric.  DESCRIPTION OF PROCEDURE:  After informed consent was obtained, the patient was placed in the sitting position and monitored.  Her back was prepped with Betadine x 3.  The skin level was raised at the L4-5 interspace with 1% lidocaine.  A 20-gauge Tuohy needle was introduced into the lumbar epidural space with loss of resistance to preservative-free normal saline.  The depth was 7 cm.  There was no CSF nor blood.  Eighty milligrams of Medrol and 8 ml of preservative-free normal saline was gently injected.  The needle was flushed and removed intact.  POSTPROCEDURE CONDITION:  Stable.  DISCHARGE INSTRUCTIONS: 1. Resume previous diet. 2. Limitation and activities per instruction sheet. 3. Continue on current medications. 4. Followup with me in 1-2 weeks for repeat injection. DD:  08/03/00 TD:  08/03/00 Job: [id number]

## 2010-07-03 NOTE — Procedures (Signed)
[facility]  Patient:    [name], [name]                    MRN: [id number] Proc. Date: 05/24/00 Adm. Date:  [id number] Attending:  Orland Mustard CC:         Duncan Dull, M.D.                           Procedure Report  PROCEDURE:  Colonoscopy.  ENDOSCOPIST:  Llana Aliment. Edwards, M.D.  MEDICATIONS:  Fentanyl 75 mcg, Versed 6 mg IV.  SCOPE:  Olympus adult video colonoscope.  INDICATION:  Patient has had a previous colonoscopy done three years ago with a rectal polyp removed.  This is done as a three-year followup.  DESCRIPTION OF PROCEDURE:  Procedure had been explained to the patient and consent obtained.  With the patient in left lateral decubitus position, the Olympus adult video colonoscope was inserted and advanced under direct visualization.  The prep was excellent.  We were able to advance to the cecum using abdominal pressure and position changes.  Ileocecal valve was seen.  The cecum, ascending colon, hepatic flexure, transverse colon, splenic flexure, descending and sigmoid colon were seen well.  Upon withdrawal, moderate diverticulosis in the sigmoid, no polyps seen throughout the entire colon. Scope was withdrawn back in the rectum.  There was no evidence of any residual or further rectal polyps.  ASSESSMENT: 1. No evidence of further polyps. 2. Moderate diverticulosis.  PLAN:  Would recommend repeating in five years and would recommend yearly Hemoccults. DD:  05/24/00 TD:  05/24/00 Job: [id number]

## 2010-07-03 NOTE — Discharge Summary (Signed)
[place]. [facility]  Patient:    [name], [name] Visit Number: [id number] MRN: [id number]          Service Type: Attending:  Jesse Sans. Wall, M.D. [facility] Dictated by:   Rozell Searing, P.A. Adm. Date:  11/03/00 Disc. Date: 11/04/00                    Referring Physician Discharge [place]  PROCEDURES:  None.  REASON FOR ADMISSION:  Patient is a 77 year old female with no known history of coronary artery disease who was recently admitted this past May for atrial flutter.  A cardiac evaluation at that time included a normal Cardiolite and echocardiogram.  Patient presented from Dr. Vear Clock office with rapid heartbeat and associated dizziness.  She was referred to the emergency room and electrocardiography suggested atrial tachycardia versus atypical flutter.  She was admitted for management and further evaluation.  LABORATORY DATA:  WBC 7.9, HGB 14.3, HCT 41.5, platelet 291.  INR 0.9.  Sodium 139, potassium 4.6, glucose 121, BUN 24, creatinine 1.0.  Liver enzymes normal.  CPK/MB negative x 2, troponin I 0.01 x 2.  TSH 6.52.  HOSPITAL COURSE:  Patient was placed on IV diltiazem for rate control.  She was continued on her home dose of Tenormin 25 mg q.d.  She was placed on intravenous heparin for anticoagulation.  Dr. Daleen Squibb recommended Coumadin anticoagulation, citing no contraindications. He also recommended an electrophysiology evaluation to see if patient is a candidate for radiofrequency ablation.  Later that same evening, patient converted to NSR wherein she remained by time of discharge the following morning.  Final recommendations at time of discharge:  Increase Tenormin to 50 mg q.d. and initiate Coumadin at 5 mg q.d.  DISCHARGE MEDICATIONS: 1. Atenolol 50 mg q.d. 2. Coumadin 5 mg q.d. 3. Lasix 40 mg q.d. 4. K-Dur 20 mEq q.d. 5. Aspirin 81 mg q.d. 6. Nortriptyline 50 mg q.h.s.  INSTRUCTIONS:  FOLLOW-UP:  Patient is to follow up at the [facility] on Monday, September 23 for a follow-up protime level.  Patient is scheduled to follow up with Dr. Odessa Fleming on Tuesday, October 1 at 10 a.m.  DISCHARGE DIAGNOSES: 1. Recurrent atrial flutter.    a. Conversion to normal sinus rhythm after intravenous diltiazem.    b. Status post atrial flutter May 2002.    c. History of normal left ventricular function. 2. Hypertension. 3. Chronic lower extremity edema. 4. Mildly elevated TSH. Dictated by:   Rozell Searing, P.A. Attending:  Jesse Sans. Daleen Squibb, M.D. [facility] DD:  11/04/00 TD:  11/04/00 Job: [id number]

## 2010-07-03 NOTE — H&P (Signed)
[facility]  Patient:    [name], [name]                    MRN: [id number] Adm. Date:  [id number] Attending:  Thyra Breed CC:         Marlan Palau, M.D.             Duncan Dull, M.D.                         History and Physical  HISTORY OF PRESENT ILLNESS:  Kaisey comes in for followup evaluation of her chronic low back pain syndrome on the basis of lumbar spondylosis and mild scoliosis.  Since her last evaluation, the patient has been relatively stable on her current medical regimen.  She continues to have dysesthesias in her left thigh, but no esthesias are helped by the nortriptyline.  She only tolerates two tablets of this at night.  At three tablets she had vivid nightmares and her appetite was out of control.  She has put on 15 pounds. She continues on the Vioxx, and notes that this helps with her arthritic pain as long as she does not overdo.  OTHER MEDICATIONS: 1. Hydrochlorothiazide. 2. Aspirin. 3. Rhinocort. 4. Diovan.  PHYSICAL EXAMINATION:  VITAL SIGNS:  Blood pressure 153/76, heart rate 104, respiratory rate 16, O2 saturation 98%, pain level is 5/10.  NEUROLOGIC:  Deep tendon reflexes were symmetric in the lower extremities with negative straight leg raise signs.  Hyperextension of her back to 30 degrees mildly increased her discomfort.  Forward flexion is intact.  IMPRESSION: 1. Lumbar spondylosis, stable. 2. Neuralgia paresthetica responsive to her current medical regimen. 3. Other medical problems per Dr. Shaune Pollack.  DISPOSITION:  I advised the patient I would like to see her back in followup in about 8 weeks.  This would be about 7 months from her initial series of epidurals, which she stated did help her significantly.  She does not feel like she needs them currently because she feels like her pain is fairly well controlled with her current medical regimen.  She does not need refills on her prescriptions  today. DD:  05/17/00 TD:  05/17/00 Job: [id number]

## 2010-07-03 NOTE — Procedures (Signed)
[place]. [facility]  Patient:    [name], [name] Visit Number: [id number] MRN: [id number]          Service Type: CAT Location: 3700 [id number] Attending Physician:  Nathen May Dictated by:   Nathen May, M.D., [facility] Proc. Date: 12/14/00 Admit Date:  12/14/2000   CC:         Duncan Dull, M.D.                           Procedure Report  OPTICAL DISK:  130-B.  PREOPERATIVE DIAGNOSIS:  Atrial flutter.  POSTOPERATIVE DIAGNOSIS:  Atrial fibrillation.  PROCEDURE:  Electrophysiological study with RF catheter ablation with advanced electronic anatomical mapping.  DESCRIPTION OF PROCEDURE:  Following the obtainment of informed consent, the patient was brought to the electrophysiology laboratory and submitted for general anesthesia under the care of Dr. Zoila Shutter.  After routine prep and drape, cardiac catheterization was performed.  Following the procedure the ACT as monitored.  The catheters were removed after the ACT was below 200.  This had not yet been accomplished.  CATHETERS: 1. The 5-French quadripolar catheter was inserted via the left femoral    vein to the AV junction. 2. A 7-French dual decapolar catheter was inserted via the right femoral vein,    to the high right atrium along the tricuspid annulus to the coronary sinus. 3. A 9-French sheath was introduced via the left femoral vein, allowing the    passage of an ESI 1000 multielectrode array mapping catheter. 4. An 8-French 8 mm deflectable tip catheter was inserted via the    right femoral vein, to mapping sites in the posterior septal space.  Surface leads to one AVF and V1 were monitored continuously throughout the procedure.  Following the insertion of the catheters, the stimulation protocol included: 1. Incremental atrial pacing. 2. Incremental ventricular pacing. 3. Coronary sinus bur spacing. 4. Single atrial extrastimuli at a paced cycle length of 600 and  700 msec.  RESULTS:  SURFACE ELECTROCARDIOGRAM:                               INITIAL           FINAL                               Rhythm            Sinus CYCLE LENGTH                  901 msec          952 msec P-R INTERVAL                  217 msec          206 msec QRS DURATION                  92 msec           96 msec Q-T INTERVAL                  417 msec          475 msec P-WAVE DURATION               137 msec          150 msec BUNDLE  BRANCH BLOCK           Absent            Absent PRE-EXCITATION                Absent            Absent  AV NODAL FUNCTION: A-H INTERVAL                  96 msec          96 msec AV WENCKEBACH status post ablation was 400 msec. VA CONDUCTION                 dissociated The AV nodal was extra-refractory.  At a paced cycle length of 700 msec was less than or equal to 400 msec, representing the functional refractory period of the atrium.  This was via extra-coronary sinus pacing.  AV conduction was continuous.  HIS PURKINJE: H-V INTERVAL                  53 msec                 61 msec HIS BUNDLE DURATION           16 msec                 16 msec  ACCESSORY PATHWAY FUNCTION:  No evidence of any accessory pathway was identified.  Arrhythmia is induced.  Atrial fibrillation and atrial flutters were repeatedly induced with coronary sinus pacing.  The QRS morphology was normal.  The cycle length ranged from 260 to 309 msec.  Atrial fibrillation emerged out of one of these inductions, which was persistent.  The patient was submitted to a 100 joule shock delivered synchronously with a biwave form, cardioverting the patient back to sinus rhythm.  Based on the electrocardiographic morphologies and the patients previously identified atrial flutter, it was elected to undertake isthmus ablation, in the hopes of preventing clinical recurrences.  No previous episodes of atrial fibrillation have been identified.  FLUOROSCOPY TIME:  A total of 20 min of  fluoroscopy time was utilized at 15 frames/sec.  Using the endocardial solutions, multiple electrode array positioned at the ______ of the coronary sinus.  A geometry was created.  A juncture ______ was created with particular attention to the lower half of the atrium.  A line was then identified at 5-6 oclock from the tricuspid annulus to the IBC. Sequential 30 sec bones were placed contiguously to create a line of block. On one occasion on the fluoroscopic deployment appeared to be too far proximal towards the IBC, and it was moved more distally.  With this being among the number of enhancements of catheter deployment, a single line of radiofrequency lesions was placed.  Hereafter, bidirectional block was demonstrated by both coronary sinus and lateral high right atrial pacing.  A total of 3 min 21 sec of radiowave energy was applied in this continuous lesion, which resulted in bidirectional block.  IMPRESSION: 1. Normal sinus function. 2. Abnormal atrial function, manifested by previously demonstrated sustained    atrial flutter and currently demonstrated atrial fibrillation was    persisting, requiring cardioversion. 3. Normal AV nodal function. 4. Normal His Purkinje system function. 5. No accessory pathway. 6. Normal ventricular response to stimulation, as described above.  SUMMARY:  In conclusion, the results of electrophysiological testing failed to reproduce the patients clinical atrial flutter.  However, on the electrocardiographic pattern, it was elected  to undertake a tricuspid valve IBC isthmus burn.  This was successful in creating bidirectional isthmus block.  Because of the patients identified atrial fibrillation, we recommend that Coumadin therapy for three to six months, awaiting clinical recurrences. In the event that she has none, I would recommend discontinuation of the Coumadin.  We will undertake endocarditis prophylaxis for six weeks. Dictated by:   Nathen May, M.D., [facility] Attending Physician:  Nathen May DD:  12/14/00  TD:  12/15/00 Job: [id number]

## 2010-07-03 NOTE — H&P (Signed)
NAME:  [name], [name]             ACCOUNT NO.:  [id number]   MEDICAL RECORD NO.:  [id number]          PATIENT TYPE:  INP   LOCATION:  NA                           FACILITY:  [facility]   PHYSICIAN:  Donalee Citrin, M.D.        DATE OF BIRTH:  1925/05/18   DATE OF ADMISSION:  03/03/2003  DATE OF DISCHARGE:                                HISTORY & PHYSICAL   ADMITTING DIAGNOSES:  1.  Degenerative scoliosis.  2.  Degenerative spondylolisthesis, L3-4.   PROCEDURES DURING THIS HOSPITALIZATION:  Decompressive laminectomy and  posterior lumbar interbody fusion procedure, L3-4.   SECONDARY DIAGNOSIS:  Hypertension.   HISTORY OF PRESENT ILLNESS:  The patient is a 77 year old female who has had  longstanding back and prominent left greater than right leg pain radiating  down to her anterior shin consistent with an L4 nerve root distribution.  The patient failed all forms of conservative treatment with epidural steroid  injections, physical therapy and time.  This has gotten progressively worse  over the last several years and the patient presents now with progressive  worsening back greater than leg pain.  The back pain is predominantly  mechanical in nature, worse when going from lying to sitting and standing  position, and she does get some relief lying down.   PAST MEDICAL HISTORY:  1.  Her past medical history is significant for hypertension.  2.  She has had a history of an ablation for dysrhythmia back in 2003.   REVIEW OF SYSTEMS:  Her review of systems is unremarkable.   PAST SURGICAL HISTORY:  Past surgical history includes:  1.  C-section.  2.  Hysterectomy in the '70s.  3.  A toe fusion in the '50s.  4.  Gallbladder removal in '71.  5.  The ablation in '03.   SOCIAL HISTORY:  She is a nonsmoker.   MEDICATIONS:  1.  Aspirin.  2.  Atenolol 50 mg daily.  3.  Diovan 160 mg daily.  4.  Lasix 40 mg daily.  5.  Klor-Con 10 mEq daily.  6.  Lexapro 10 mg daily.  7.  Requip 1  daily.  8.  Vicodin.  9.  Lidoderm patch.   ALLERGIES:  Allergies include NAPROSYN, DAYPRO, HORMONE REPLACEMENT,  __________, RELAFEN, MORPHINE, DURAGESIC, FOSAMAX, CARDIZEM and CLIMARA.   PHYSICAL EXAMINATION:  GENERAL:  On physical exam, she is a very pleasant,  awake and alert 77 year old female in no apparent distress.  HEENT:  Within normal limits.  NECK:  Neck is supple.  LUNGS:  Lungs are clear.  HEART:  Heart is regular.  NEUROLOGIC:  Neurologically, she has 5/5 strength in her upper and lower  extremities, normal symmetric reflexes and absent ankle jerks bilaterally.  Sensation appears to be slightly decreased in the left leg in an L4  distribution.   IMAGING STUDIES:  Preop imaging showed severe degenerative scoliosis and  degenerative spondylolisthesis at L3-4 with a transitional level __________  the S1 vertebra.   PLAN:  The patient was subsequently explained the risks and benefits of a  decompressive and stabilizing procedure; she understands  and wants to  proceed forth.       GC/MEDQ  D:  03/02/2004  T:  03/02/2004  Job:  [id number]

## 2010-07-03 NOTE — Procedures (Signed)
[facility]  Patient:    [name]                    MRN: [id number] Proc. Date: 08/10/00 Adm. Date:  [id number] Attending:  Thyra Breed CC:         Marlan Palau, M.D.  Duncan Dull, M.D.  Luis Abed, M.D. [facility]   Procedure Report  PREOPERATIVE DIAGNOSIS:  Low back pain on the basis of lumbar spondylosis.  POSTOPERATIVE DIAGNOSIS:  Low back pain on the basis of lumbar spondylosis.  PROCEDURE:  Lumbar epidural steroid injection.  SURGEON:  Thyra Breed, M.D.  INTERVAL HISTORY:  The patient has noted marked improvement after her repeat injection.  She feels as though this series is doing better than the last series she went through.  She rates her pain at 6 out of 10 today.  She had minimal side effects from the first injection.  PHYSICAL EXAMINATION:  Blood pressure 146/78, heart rate 66, respirations 18, and O2 saturations 98%.  Pain level is 6 out of 10.  She shows good healing from previous injection site.  DESCRIPTION OF PROCEDURE:  After informed consent was obtained, the patient was placed in the sitting position and monitored.  Her back was prepped with Betadine x 3.  The skin level was raised at the L4-5 interspace with 1% lidocaine.  A 20-gauge Tuohy needle was introduced into the lumbar epidural space with loss of resistance to preservative-free normal saline.  The depth was 7 cm.  There was no CSF nor blood.  Preservative-free normal saline in a volume of 8 ml mixed with 80 mg of Medrol was injected gently.  The needle was flushed and removed intact.  POSTPROCEDURE CONDITION:  Stable.  DISCHARGE INSTRUCTIONS: 1. Resume previous diet. 2. Limitation and activities per instruction sheet as outlined by my assistant    today. 3. Continue current medications. 4. Followup with me in one to two weeks for repeat injection. DD:  08/10/00 TD:  08/10/00 Job: [id number]

## 2010-07-03 NOTE — Op Note (Signed)
[name], [name]             ACCOUNT NO.:  [id number]   MEDICAL RECORD NO.:  [id number]          PATIENT TYPE:  OIB   LOCATION:  2899                         FACILITY:  [facility]   PHYSICIAN:  Tia Alert, MD     DATE OF BIRTH:  11-08-1925   DATE OF PROCEDURE:  04/17/2004  DATE OF DISCHARGE:                                 OPERATIVE REPORT   PREOPERATIVE DIAGNOSIS:  Wound dehiscence with superficial infection.   POSTOPERATIVE DIAGNOSIS:  Wound dehiscence with superficial infection.   PROCEDURE:  Lumbar irrigation and debridement with lumbar wound revision.   SURGEON:  Marikay Alar, MD   ANESTHESIA:  General endotracheal.   COMPLICATIONS:  None apparent.   INDICATIONS FOR PROCEDURE:  [name] is a patient of Dr. Lonie Peak who  underwent a posterior lumbar interbody fusion about 6 weeks ago, who  presented with a superficial wound infection.  She was on Keflex, but she  had dehisced part of her wound and had some fibrinous exudate, but no  significant drainage.  The wound edges were indurated and erythematous.  I  recommended an irrigation and debridement in the operating room with primary  closure.  She understood the risks, benefits and expected outcome and wished  to proceed.   DESCRIPTION OF THE PROCEDURE:  The patient was taken to operating room and  after induction of adequate general endotracheal anesthesia, she was rolled  into a prone position on the Wilson frame and all pressure points were  padded.  Her lumbar region was prepped with DuraPrep and then draped in the  usual sterile fashion.  The indurated skin edges as well as the fibrinous  exudate and necrotic tissues were excised; this measured about 4 cm in  length and I the tissues were incised with a 15 blade scalpel and carried  down to bleeding edges.  Once I got good bleeding edges,  I irrigated with  copious amounts of bacitracin-containing saline solution and then closed the  wound with layers of 2-0  Vicryl and 3-0 Vicryl and then closed the skin with  Benzoin and Steri-Strips.  I did injected about 20 mL of 0.25% Sensorcaine  during closure for postoperative pain management.  A sterile dressing was  applied.  The drapes removed.  The patient was taken out of the operating  room in stable condition.  At the end of the procedure, all sponge, needle  and sponge counts were correct.      DSJ/MEDQ  D:  04/17/2004  T:  04/17/2004  Job:  [id number]

## 2010-07-03 NOTE — Procedures (Signed)
[facility]  Patient:    [name], [name]                    MRN: [id number] Proc. Date: 12/18/99 Adm. Date:  [id number] Attending:  Thyra Breed CC:         Duncan Dull, M.D.  Marlan Palau, M.D.   Procedure Report  PROCEDURE:  Lumbar epidural steroid injection.  DIAGNOSIS:  Lumbar spondylosis with scoliosis and prominent degenerative disk disease on MRI.  INTERVAL HISTORY:  The patient has noted that her SI joint discomfort continues to be improved overall, but she complains of ongoing radiation of her pain from her back out to her left thigh region.  PHYSICAL EXAMINATION:  VITAL SIGNS:  Blood pressure 152/82, heart rate 94, respiratory rate 18, O2 saturation 96%, pain level is 3 out of 10.  NEUROLOGIC:  Grossly unchanged from previously.  I explained in great detail the potential risks, benefits, and the reasons for proceeding with lumbar epidural steroid injections.  The patient is interested in pursuing this.  DESCRIPTION OF PROCEDURE:  After informed consent was obtained, the patient was placed in the sitting position and monitored.  Her back was prepped with Betadine x 3.  A skin wheal was raised at the L4-5 interspace with 1% lidocaine.  A 20-gauge Tuohy needle was introduced to the lumbar epidural space and loss of resistance to preservative-free normal saline.  There was no CSF nor blood.  Medrol 80 mg in 8 ml of preservative-free normal saline was gently injected.  The needle was flushed with preservative-free normal saline and removed intact.  POSTPROCEDURE CONDITION:  Stable.  DISCHARGE INSTRUCTIONS: 1. Resume previous diet. 2. Limitation of activities per instruction sheet. 3. Continue on current medications. 4. Follow up with me one week to two weeks for a repeat injection. DD:  12/18/99 TD:  12/18/99 Job: [id number]

## 2010-07-03 NOTE — H&P (Signed)
[facility]  Patient:    [name], [name]                    MRN: [id number] Adm. Date:  [id number] Attending:  Thyra Breed CC:         Marlan Palau, M.D.  Duncan Dull, M.D.   History and Physical  FOLLOW-UP EVALUATION  [name] comes in for follow-up evaluation after SI joint injection.  She has noted that the SI joint area has markedly improved, but she continues to have pain radiating out into the left lateral thigh.  She continues on her current medical regimen which includes nortriptyline which she says is making her dizzy.  Her other medications include Vioxx, hydrochlorothiazide, Avapro, Tylenol Extra Strength, and vitamins.  PHYSICAL EXAMINATION:  VITAL SIGNS:  Blood pressure is 146/76, heart rate is 99, respiratory rate is 18, O2 saturations 96%.  Pain level is 8/10.  Temperature is 97.3.  She continues to have pain radiating out into the left lower extremity with negative straight leg raise signs.  Deep tendon reflexes were symmetric.  IMPRESSION:  Left SI joint pain with chronic low back pain radiating out into the left thigh and left calf.  DISPOSITION:  I advised the patient that we may need to consider a series of epidural steroid injections.  In the interim I have gone ahead and taken her off of her Imipramine and switched her over to Desipramine to see whether she tolerates this better with less tinnitus.  I will go ahead and start her on 25 mg one p.o. q.p.m., #30, with two refills.  We will go ahead and schedule her back at her next available time when she can get her husband to bring her in. DD:  12/15/99 TD:  12/15/99 Job: [id number]

## 2010-07-03 NOTE — Procedures (Signed)
[facility]  Patient:    [name], [name] Visit Number: [id number] MRN: [id number]          Service Type: CAT Location: 3700 [id number] Attending Physician:  Nathen May Proc. Date: 10/03/00 Admit Date:  12/14/2000 Discharge Date: 12/15/2000   CC:         Marlan Palau, M.D.             Duncan Dull, M.D.                           Procedure Report  PROCEDURE:  SI joint injection on the left side.  DIAGNOSIS:  Left hip pain and low back pain with known underlying lumbar spondylosis.  INTERVAL HISTORY:  The patient was last seen on September 23, 2000, at which time we discussed going ahead and getting another SI joint injection.  She has had the epidurals done back in July, so hopefully by doing the left SI joint injection, we may be able to reduce some of her symptoms.  CURRENT MEDICATIONS:  Tenormin, Lasix, Motrin, aspirin, nortriptyline.  PHYSICAL EXAMINATION:  Blood pressure 155/92, heart rate 17, respiratory rate 21, O2 saturations 98%.  The patient has some mild swelling of the left lower extremity.  Neuro exam is grossly unchanged from her previous visit.  DESCRIPTION OF PROCEDURE:  After informed consent was obtained, the patient was placed in the prone position in the Fluoroscopy Suite on the fluoroscopy table.  The left SI joint was identified using fluoroscopic guidance and visualization.  This was optimized.  The skin overlying the hip was prepped with Betadine x 3 and draped out.  Using a 25 gauge needle, I anesthetized the skin and subcutaneous structures with 2 cc of 1% lidocaine.  A 25 gauge spinal needle was introduced into the left SI joint at the junction of the middle third and lower thirds.  The needle placement was confirmed by AP, oblique, and lateral projections.  Aspiration was negative.  I injected 0.5 cc of 1% lidocaine.  Medrol 40 mg mixed with 1 cc of 1% lidocaine was very slowly injected.  The needle was  flushed with 1% lidocaine and removed intact.  POSTPROCEDURE CONDITION:  Stable with decreased discomfort.  DISPOSITION: 1. Continue on current medications. 2. Resume previous diet. 3. Limitations on activities per instruction sheet, as outlined by my    assistant today. 4. Follow up with me in 1-3 months depending on how well she responds. Attending Physician:  Nathen May DD:  10/03/00 TD:  10/03/00 Job: [id number]

## 2010-07-03 NOTE — Procedures (Signed)
[facility]  Patient:    [name], [name]                    MRN: [id number] Proc. Date: 08/17/00 Adm. Date:  [id number] Attending:  Thyra Breed CC:         Marlan Palau, M.D.  Duncan Dull, M.D.  Luis Abed, M.D. [facility]   Procedure Report  PROCEDURE:  Lumbar epidural steroid injection.  DIAGNOSIS:  Lumbar spondylosis.  INTERVAL HISTORY:  The patient was doing remarkably well after her second injection until her family came to visit, and she has overdone.  She has increased pain as a result.  She wants another injection today.  PHYSICAL EXAMINATION:  Blood pressure 139/69, heart rate 66, respiratory rate 18, O2 saturations 95%.  Pain level is 6/10.  She exhibits good healing from her previous injection site.  DESCRIPTION OF PROCEDURE:  After informed consent was obtained, the patient was placed in a sitting position and monitored.  Her back was prepped with Betadine x 3.  A skin wheal was raised at the L4-L5 interspace with 1% lidocaine.  A 20 gauge Tuohy needle was introduced in the lumbar epidural space to loss of resistance to preservative-free normal saline.  There was no CSF nor blood.  The depth was 7 cm.  A mixture of 8 mL of preservative free normal saline with 80 mg of Medrol was gently injected.  The needle was flushed and removed intact.  Postprocedure condition - stable.  DISCHARGE INSTRUCTIONS: 1. Resume previous diet. 2. Limitations on activities per instruction sheet, as outlined by my    assistant today. 3. Continue on current medications. 4. Follow up with me in six weeks, at which time we will discuss whether she    needs to proceed with consideration for a facet joint injection. DD:  08/17/00 TD:  08/17/00 Job: [id number]

## 2010-07-03 NOTE — Discharge Summary (Signed)
[place]  Patient:    [name], [name] Visit Number: [id number] MRN: [id number]          Service Type: CAT Location: 3700 3736 [id number] Attending Physician:  Nathen May Dictated by:   Chinita Pester, N.P. Admit Date:  12/14/2000 Discharge Date: 12/15/2000   CC:         Duncan Dull, M.D., [address], [place], [place], [address]  Thyra Breed, M.D.   Discharge Summary  HISTORY OF PRESENT ILLNESS:   This is a 77 year old female with a history of hypertension, who had two episodes of documented atrial flutter.  They have been associated with lightheadedness and chest discomfort, but without palpitations and significant shortness of breath.  The patient underwent cardiologic screening.  An echo and a Cardiolite was were performed which were both normal.  She was placed on Atenolol and had recurrence of her atrial flutter.  She was admitted to the hospital for an atrial flutter ablation.  HOSPITAL COURSE:  The patient was admitted to the hospital.  She underwent a successful atrial flutter ablation.  She had successful induction of bidirectional isthmus block with induction of atrial fibrillation.  She would need to continue Coumadin for six months.  She tolerated the procedure well. Postoperative course was uneventful.  DISCHARGE MEDICATIONS:  She was discharged to home the following day on: 1. Coumadin 5 mg daily except Tuesday and Friday 7.5 mg for six months. 2. Nortriptyline 50 nightly. 3. K-Dur 20 daily. 4. Lasix 40 daily. 5. Atenolol 50 daily. 6. Coated baby aspirin once daily. 7. Antibiotics prior to any dental work, urine work or bowel work for the next    three months.  ACTIVITY:  She was not to do any heavy lifting or strenuous activity for four days, no driving for two.  DIET:  Low-fat, low-cholesterol, low-salt diet.  DISCHARGE INSTRUCTIONS:  She was to remove her band-aids, to call if she developed any  drainage or lump.  She is to follow up with the Coumadin clinic on Monday at 3:45 p.m. and to follow with Dr. Graciela Husbands on December 3, at 3:45 p.m. Dictated by:   Chinita Pester, N.P. Attending Physician:  Nathen May DD:  12/15/00 TD:  12/15/00 Job: [id number]

## 2010-07-03 NOTE — H&P (Signed)
[facility]  Patient:    [name], [name]                    MRN: [id number] Adm. Date:  [id number] Disc. Date: [id number] Attending:  Thyra Breed CC:         Marlan Palau, M.D.  Luis Abed, M.D. [facility]  Duncan Dull, M.D.   History and Physical  FOLLOWUP EVALUATION:  Mariela was doing well after last epidurals but went to [place] for a family reunion and has been miserable since then.  She has pain that emanates from her left SI joint out into her lower extremities.  She is asking about getting an SI joint injection.  She was taken off of her Vioxx with her leg edema, which was felt to be on the basis of Cardizem in retrospect, and was placed on ibuprofen, which she states is not very helpful. She describes her pain as fairly significant and limits her fairly significantly during the course of the day.  She has no new neurologic symptoms.  The patient has had intermittent numbness of the left second toe.  CURRENT MEDICATIONS:  Tenormin, Lasix, Kaochlor, Motrin, aspirin and nortriptyline.  EXAMINATION:  VITAL SIGNS:  Blood pressure 142/91.  Heart rate is 87.  Respiratory rate 16. O2 saturation is 97%.  Pain level is 6/10.  NEUROLOGIC:  Straight leg raise signs were negative and deep tendon reflexes were symmetric in the lower extremities.  She does have hyperalgesia over the lateral aspect of the left thigh.  BACK:  She is tender to palpation over the left SI joint.  IMPRESSION: 1. Left sacroiliac joint pain which seems to be bothering her rather    significantly. 2. Lumbar spondylosis. 3. Other medical problems per Dr. Duncan Dull.  DISPOSITION:  I advised the patient that it might be helpful to give her another trial of SI joint injections and we are going to go ahead and schedule her for this.  In addition, I have gone head and restarted the Vioxx at 12.5 mg one to two p.o. q.d. to see whether she does have any  exacerbations of her swelling or elevations in her blood pressure.  She is more than willing to try this for the time-being.  I plan to see her next week for SI joint injections.DD:  09/23/00 TD:  09/24/00 Job: [id number]

## 2010-07-03 NOTE — Discharge Summary (Signed)
[name], [name]             ACCOUNT NO.:  [id number]   MEDICAL RECORD NO.:  [id number]          PATIENT TYPE:  INP   LOCATION:  3029                         FACILITY:  [facility]   PHYSICIAN:  Donalee Citrin, M.D.        DATE OF BIRTH:  Jan 09, 1926   DATE OF ADMISSION:  03/02/2004  DATE OF DISCHARGE:  03/06/2004                                 DISCHARGE SUMMARY   ADMISSION DIAGNOSIS:  Severe lumbar spinal stenosis and spondylolisthesis at  L3-L4.   PROCEDURE:  Decompressive laminectomy and posterior lumbar interbody fusion  at L3-L4.   HOSPITAL COURSE:  The patient is a very pleasant 77 year old female who was  admitted and taken to the operating room where she underwent the  aforementioned procedure.  Postop, the patient did very well in recovery on  the floor.  She was afebrile on hospital day one, tolerating regular diet,  her strength was 5/5.  She had a little bit of swelling of the left lower  extremity.  Venous duplex Doppler was performed to rule out DVT and this was  negative.  The patient was kept on SCDs and progressive stockings.  She was  taken off her Percocet which she did not tolerate and switched back to  Vicodin.  On postop day two, the patient did very well with physical  therapy, was ambulating and voiding spontaneously.  The pain became under  much better control.  The patient was neurologically intact with 5/5  strength.  She was able to be discharged home on hospital day four with  Home Health PT and follow up in two weeks.      GC/MEDQ  D:  05/13/2004  T:  05/13/2004  Job:  [id number]

## 2010-07-03 NOTE — Op Note (Signed)
[facility]  Patient:    [name], [name]                    MRN: [id number] Proc. Date: 01/05/00 Adm. Date:  [id number] Attending:  Thyra Breed CC:         Duncan Dull, M.D.  Marlan Palau, M.D.   Operative Report  PROCEDURE:  Lumbar epidural steroid injection.  DIAGNOSIS:  Lumbar spondylosis with underlying scoliosis.  ANESTHESIOLOGIST:  Thyra Breed, M.D.  INTERVAL HISTORY:  The patient noted overall improvement.  She continues to have some discomfort over her left hip but is able to lie on that side which she once could not do.  In general, she feels like she has improved overall.  She plans to see her orthopedic surgeon in the ensuing weeks with regard to her left knee.  PHYSICAL EXAMINATION:  VITAL SIGNS:  Blood pressure 146/78, heart rate 102, respiratory rate 12, O2 saturation 98%, pain level 4/10, temperature 96.9.  BACK:  Shows good healing from previous injection site.  PROCEDURE:  After informed consent was obtained, the patient was placed in the sitting position and monitored.  Her back was prepped with Betadine x 3.  A skin wheal was raised at the L3-4 inner space with 1% lidocaine.  A 20-gauge Tuohy needle was introduced in the lumbar epidural space to loss of resistance to preservative free normal saline.  There was no CSF nor blood.  Then 80 mg of Medrol and 8 ml preservative free normal saline was gently injected.  The needle was flushed with preservative free normal saline and removed intact.  POSTPROCEDURE CONDITION:  Stable.  DISCHARGE INSTRUCTIONS: 1. Resume previous diet. 2. Limitation of activities per instruction sheet as outlined by my assistant    today. 3. Continue on current medications. 4. Follow up with me in four weeks for followup evaluation.  ADDENDUM:  The patient continues to have tinnitus despite going to the desipramine.  I advised her that it may be the combination of medications she is  taking, or it may be unrelated to her medications in general.  She does not wish to go off her medications to ascertain if it related to her medications alone as she is afraid she will hurt much worse. DD:  01/05/00 TD:  01/05/00 Job: [id number]

## 2010-07-03 NOTE — Discharge Summary (Signed)
. [facility]  Patient:    [name], [name]                    MRN: [id number] Adm. Date:  [id number] Disc. Date: [id number] Attending:  Talitha Givens Dictator:   Abelino Derrick, P.A.C. [facility]                  Referring Physician Discharge [facility]  DISCHARGE DIAGNOSES: 1. Paroxysmal atrial fibrillation, converted spontaneously prior to discharge. 2. Hypertension. 3. Chest pain, negative Cardiolite this admission.  HOSPITAL COURSE:  Patient is a 77 year old female followed by Dr. Shaune Pollack who has no prior history of coronary disease.  She had some chest discomfort off and on since Sunday.  On the day of admission - Jun 21, 2000 - she noted her heart rate was fast.  In the emergency room she was in atrial flutter. She was started on IV Cardizem and given a dose of Lopressor IV.  Heparin drip was added.  She converted quickly to sinus rhythm by the morning of May 8. Cardiolite study done Jun 22, 2000 showed no ischemia with an EF of 70%. Echocardiogram done showed an EF of 65% with no wall motion abnormality.  Dr. Myrtis Ser feels the patient can be discharged late on May 8.  He will see her back in a few weeks in follow-up.  MEDICATIONS AT DISCHARGE: 1. She had been on Diovan for hypertension prior to admission and will stop    this and add Cardizem CD 240. 2. Hydrochlorothiazide 25 mg a day. 3. Coated aspirin q.d.  LABORATORY DATA:  Sodium 135, potassium 4.1, BUN 15, creatinine 0.8, magnesium 1.8.  CK and troponins are negative.  INR 1.0.  White count 7.3, hemoglobin 14.1, hematocrit 41.2, platelets 279.  TSH 4.63.  EKG in sinus shows normal sinus rhythm without acute changes.  Chest x-ray shows no active disease.  DISPOSITION:  Patient is discharged in stable condition.  FOLLOW-UP:  With Dr. Myrtis Ser.  Dr. Myrtis Ser has decided at this point not to use Coumadin.  If she has recurrent atrial fibrillation or flutter, this will need to be considered. DD:   06/22/00 TD:  06/23/00 Job: [id number]

## 2013-10-26 ENCOUNTER — Other Ambulatory Visit: Payer: Self-pay | Admitting: Orthopaedic Surgery

## 2013-10-26 DIAGNOSIS — S22000S Wedge compression fracture of unspecified thoracic vertebra, sequela: Secondary | ICD-10-CM

## 2013-11-02 ENCOUNTER — Other Ambulatory Visit: Payer: Medicare Other

## 2014-10-23 ENCOUNTER — Ambulatory Visit
Admission: RE | Admit: 2014-10-23 | Discharge: 2014-10-23 | Disposition: A | Discharge status: Home / Self Care | Payer: Medicare Other | Source: Ambulatory Visit | Attending: Geriatric Medicine | Admitting: Geriatric Medicine

## 2014-10-23 ENCOUNTER — Other Ambulatory Visit: Payer: Self-pay | Admitting: Geriatric Medicine

## 2014-10-23 DIAGNOSIS — R05 Cough: Secondary | ICD-10-CM

## 2014-10-23 DIAGNOSIS — R059 Cough, unspecified: Secondary | ICD-10-CM

## 2017-07-16 DEATH — deceased
